# Patient Record
Sex: Female | Born: 1965 | Race: Black or African American | Hispanic: No | Marital: Married
Health system: Southern US, Community
[De-identification: ages and names within clinical notes are randomized; demographics above are authoritative.]

## PROBLEM LIST (undated history)

## (undated) DIAGNOSIS — M1990 Unspecified osteoarthritis, unspecified site: Secondary | ICD-10-CM

## (undated) DIAGNOSIS — C801 Malignant (primary) neoplasm, unspecified: Secondary | ICD-10-CM

## (undated) DIAGNOSIS — I10 Essential (primary) hypertension: Secondary | ICD-10-CM

## (undated) HISTORY — PX: TONSILLECTOMY: SUR1361

## (undated) HISTORY — PX: ABDOMINAL HYSTERECTOMY: SHX81

---

## 2018-12-02 ENCOUNTER — Emergency Department: Payer: No Typology Code available for payment source

## 2018-12-02 ENCOUNTER — Emergency Department
Admission: EM | Admit: 2018-12-02 | Discharge: 2018-12-02 | Disposition: A | Discharge status: Home / Self Care | Payer: No Typology Code available for payment source | Attending: Emergency Medicine | Admitting: Emergency Medicine

## 2018-12-02 ENCOUNTER — Encounter: Payer: Self-pay | Admitting: Emergency Medicine

## 2018-12-02 ENCOUNTER — Other Ambulatory Visit: Payer: Self-pay

## 2018-12-02 DIAGNOSIS — I10 Essential (primary) hypertension: Secondary | ICD-10-CM | POA: Diagnosis not present

## 2018-12-02 DIAGNOSIS — R51 Headache: Secondary | ICD-10-CM | POA: Diagnosis present

## 2018-12-02 DIAGNOSIS — N644 Mastodynia: Secondary | ICD-10-CM | POA: Insufficient documentation

## 2018-12-02 DIAGNOSIS — R109 Unspecified abdominal pain: Secondary | ICD-10-CM | POA: Diagnosis not present

## 2018-12-02 HISTORY — DX: Essential (primary) hypertension: I10

## 2018-12-02 HISTORY — DX: Malignant (primary) neoplasm, unspecified: C80.1

## 2018-12-02 HISTORY — DX: Unspecified osteoarthritis, unspecified site: M19.90

## 2018-12-02 LAB — CBC
HCT: 37.8 % (ref 36.0–46.0)
Hemoglobin: 12.3 g/dL (ref 12.0–15.0)
MCH: 30.9 pg (ref 26.0–34.0)
MCHC: 32.5 g/dL (ref 30.0–36.0)
MCV: 95 fL (ref 80.0–100.0)
Platelets: 328 10*3/uL (ref 150–400)
RBC: 3.98 MIL/uL (ref 3.87–5.11)
RDW: 12.6 % (ref 11.5–15.5)
WBC: 10.4 10*3/uL (ref 4.0–10.5)
nRBC: 0 % (ref 0.0–0.2)

## 2018-12-02 LAB — BASIC METABOLIC PANEL
Anion gap: 9 (ref 5–15)
BUN: 14 mg/dL (ref 6–20)
CO2: 25 mmol/L (ref 22–32)
Calcium: 9.3 mg/dL (ref 8.9–10.3)
Chloride: 106 mmol/L (ref 98–111)
Creatinine, Ser: 0.75 mg/dL (ref 0.44–1.00)
GFR calc Af Amer: >60 mL/min (ref >60)
GFR calc non Af Amer: >60 mL/min (ref >60)
Glucose, Bld: 100 mg/dL — ABNORMAL HIGH (ref 70–99)
Potassium: 3.7 mmol/L (ref 3.5–5.1)
Sodium: 140 mmol/L (ref 135–145)

## 2018-12-02 MED ORDER — CYCLOBENZAPRINE HCL 5 MG PO TABS: ORAL_TABLET | ORAL | 0 refills | Status: AC

## 2018-12-02 MED ORDER — ACETAMINOPHEN 325 MG PO TABS
650.0000 mg | ORAL_TABLET | Freq: Once | ORAL | Status: AC
  Administered 2018-12-02: 650 mg via ORAL
  Filled 2018-12-02: qty 2

## 2018-12-02 MED ORDER — ACETAMINOPHEN 500 MG PO TABS: 500.0000 mg | ORAL_TABLET | Freq: Four times a day (QID) | ORAL | 0 refills | Status: AC | PRN

## 2018-12-02 MED ORDER — IOHEXOL 300 MG/ML  SOLN
100.0000 mL | Freq: Once | INTRAMUSCULAR | Status: AC | PRN
  Administered 2018-12-02: 20:00:00 100 mL via INTRAVENOUS
  Filled 2018-12-02: qty 100
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
